# Patient Record
Sex: Female | Born: 1985 | Race: White | Hispanic: No | Marital: Single | Smoking: Current every day smoker
Health system: Southern US, Community
[De-identification: ages and names within clinical notes are randomized; demographics above are authoritative.]

## PROBLEM LIST (undated history)

## (undated) DIAGNOSIS — F419 Anxiety disorder, unspecified: Secondary | ICD-10-CM

## (undated) HISTORY — PX: TUBAL LIGATION: SHX77

---

## 2018-11-14 ENCOUNTER — Emergency Department (HOSPITAL_BASED_OUTPATIENT_CLINIC_OR_DEPARTMENT_OTHER)
Admission: EM | Admit: 2018-11-14 | Discharge: 2018-11-14 | Disposition: A | Discharge status: Home / Self Care | Payer: Medicaid Other | Attending: Emergency Medicine | Admitting: Emergency Medicine

## 2018-11-14 ENCOUNTER — Emergency Department (HOSPITAL_BASED_OUTPATIENT_CLINIC_OR_DEPARTMENT_OTHER): Payer: Medicaid Other

## 2018-11-14 ENCOUNTER — Other Ambulatory Visit: Payer: Self-pay

## 2018-11-14 ENCOUNTER — Encounter (HOSPITAL_BASED_OUTPATIENT_CLINIC_OR_DEPARTMENT_OTHER): Payer: Self-pay | Admitting: *Deleted

## 2018-11-14 DIAGNOSIS — Z79899 Other long term (current) drug therapy: Secondary | ICD-10-CM | POA: Insufficient documentation

## 2018-11-14 DIAGNOSIS — F419 Anxiety disorder, unspecified: Secondary | ICD-10-CM | POA: Diagnosis not present

## 2018-11-14 DIAGNOSIS — R1012 Left upper quadrant pain: Secondary | ICD-10-CM | POA: Insufficient documentation

## 2018-11-14 DIAGNOSIS — M542 Cervicalgia: Secondary | ICD-10-CM | POA: Diagnosis not present

## 2018-11-14 DIAGNOSIS — F17200 Nicotine dependence, unspecified, uncomplicated: Secondary | ICD-10-CM | POA: Diagnosis not present

## 2018-11-14 DIAGNOSIS — R51 Headache: Secondary | ICD-10-CM | POA: Insufficient documentation

## 2018-11-14 DIAGNOSIS — R079 Chest pain, unspecified: Secondary | ICD-10-CM | POA: Diagnosis present

## 2018-11-14 DIAGNOSIS — N3001 Acute cystitis with hematuria: Secondary | ICD-10-CM | POA: Diagnosis not present

## 2018-11-14 HISTORY — DX: Anxiety disorder, unspecified: F41.9

## 2018-11-14 LAB — URINALYSIS, MICROSCOPIC (REFLEX)

## 2018-11-14 LAB — COMPREHENSIVE METABOLIC PANEL
ALT: 14 U/L (ref 0–44)
AST: 16 U/L (ref 15–41)
Albumin: 4 g/dL (ref 3.5–5.0)
Alkaline Phosphatase: 69 U/L (ref 38–126)
Anion gap: 5 (ref 5–15)
BUN: 12 mg/dL (ref 6–20)
CHLORIDE: 105 mmol/L (ref 98–111)
CO2: 27 mmol/L (ref 22–32)
Calcium: 8.9 mg/dL (ref 8.9–10.3)
Creatinine, Ser: 0.68 mg/dL (ref 0.44–1.00)
GFR calc Af Amer: >60 mL/min (ref >60)
GFR calc non Af Amer: >60 mL/min (ref >60)
Glucose, Bld: 88 mg/dL (ref 70–99)
POTASSIUM: 3.6 mmol/L (ref 3.5–5.1)
SODIUM: 137 mmol/L (ref 135–145)
Total Bilirubin: 0.4 mg/dL (ref 0.3–1.2)
Total Protein: 6.9 g/dL (ref 6.5–8.1)

## 2018-11-14 LAB — CBC WITH DIFFERENTIAL/PLATELET
Abs Immature Granulocytes: 0.02 10*3/uL (ref 0.00–0.07)
BASOS PCT: 0 %
Basophils Absolute: 0 10*3/uL (ref 0.0–0.1)
Eosinophils Absolute: 0.2 10*3/uL (ref 0.0–0.5)
Eosinophils Relative: 2 %
HCT: 42.6 % (ref 36.0–46.0)
Hemoglobin: 13.7 g/dL (ref 12.0–15.0)
Immature Granulocytes: 0 %
Lymphocytes Relative: 21 %
Lymphs Abs: 1.8 10*3/uL (ref 0.7–4.0)
MCH: 31.6 pg (ref 26.0–34.0)
MCHC: 32.2 g/dL (ref 30.0–36.0)
MCV: 98.4 fL (ref 80.0–100.0)
Monocytes Absolute: 0.4 10*3/uL (ref 0.1–1.0)
Monocytes Relative: 5 %
Neutro Abs: 6.3 10*3/uL (ref 1.7–7.7)
Neutrophils Relative %: 72 %
PLATELETS: 232 10*3/uL (ref 150–400)
RBC: 4.33 MIL/uL (ref 3.87–5.11)
RDW: 13.1 % (ref 11.5–15.5)
WBC: 8.7 10*3/uL (ref 4.0–10.5)
nRBC: 0 % (ref 0.0–0.2)

## 2018-11-14 LAB — URINALYSIS, ROUTINE W REFLEX MICROSCOPIC
Bilirubin Urine: NEGATIVE
Glucose, UA: NEGATIVE mg/dL
Ketones, ur: NEGATIVE mg/dL
Leukocytes, UA: NEGATIVE
Nitrite: POSITIVE — AB
Protein, ur: NEGATIVE mg/dL
Specific Gravity, Urine: 1.015 (ref 1.005–1.030)
pH: 6.5 (ref 5.0–8.0)

## 2018-11-14 LAB — PREGNANCY, URINE: Preg Test, Ur: NEGATIVE

## 2018-11-14 MED ORDER — OXYCODONE-ACETAMINOPHEN 5-325 MG PO TABS
1.0000 | ORAL_TABLET | Freq: Once | ORAL | Status: AC
  Administered 2018-11-14: 1 via ORAL
  Filled 2018-11-14: qty 1

## 2018-11-14 MED ORDER — FOSFOMYCIN TROMETHAMINE 3 G PO PACK
3.0000 g | PACK | Freq: Once | ORAL | Status: AC
  Administered 2018-11-14: 3 g via ORAL
  Filled 2018-11-14: qty 3

## 2018-11-14 MED ORDER — IOPAMIDOL (ISOVUE-300) INJECTION 61%
100.0000 mL | Freq: Once | INTRAVENOUS | Status: AC | PRN
  Administered 2018-11-14: 100 mL via INTRAVENOUS
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
## 2018-11-14 NOTE — ED Notes (Signed)
ED Provider at bedside. 
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
## 2018-11-14 NOTE — ED Notes (Signed)
Patient transported to CT 
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
